# Patient Record
Sex: Male | Born: 1975 | Race: White | Hispanic: No | Marital: Married | Smoking: Never smoker
Health system: Southern US, Community
[De-identification: ages and names within clinical notes are randomized; demographics above are authoritative.]

## PROBLEM LIST (undated history)

## (undated) HISTORY — PX: FINGER SURGERY: SHX640

---

## 1998-10-01 ENCOUNTER — Encounter: Admission: RE | Admit: 1998-10-01 | Discharge: 1998-10-01 | Payer: Self-pay | Admitting: *Deleted

## 1998-10-13 ENCOUNTER — Emergency Department (HOSPITAL_COMMUNITY): Admission: EM | Admit: 1998-10-13 | Discharge: 1998-10-13 | Payer: Self-pay | Admitting: Emergency Medicine

## 2015-07-01 ENCOUNTER — Ambulatory Visit (INDEPENDENT_AMBULATORY_CARE_PROVIDER_SITE_OTHER): Payer: Worker's Compensation | Admitting: Emergency Medicine

## 2015-07-01 VITALS — BP 110/86 | HR 81 | Temp 98.3°F | Resp 16 | Ht 75.0 in | Wt 262.4 lb

## 2015-07-01 DIAGNOSIS — S61412A Laceration without foreign body of left hand, initial encounter: Secondary | ICD-10-CM | POA: Diagnosis not present

## 2015-07-01 DIAGNOSIS — S66922A Laceration of unspecified muscle, fascia and tendon at wrist and hand level, left hand, initial encounter: Secondary | ICD-10-CM

## 2015-07-01 DIAGNOSIS — S6710XA Crushing injury of unspecified finger(s), initial encounter: Secondary | ICD-10-CM

## 2015-07-01 DIAGNOSIS — S61219A Laceration without foreign body of unspecified finger without damage to nail, initial encounter: Secondary | ICD-10-CM

## 2015-07-01 MED ORDER — SULFAMETHOXAZOLE-TRIMETHOPRIM 800-160 MG PO TABS: 1.0000 | ORAL_TABLET | Freq: Two times a day (BID) | ORAL | Status: DC

## 2015-07-01 MED ORDER — HYDROCODONE-ACETAMINOPHEN 5-325 MG PO TABS: 1.0000 | ORAL_TABLET | Freq: Four times a day (QID) | ORAL | Status: DC | PRN

## 2015-07-01 NOTE — Progress Notes (Signed)
Subjective:  Patient ID: Frederick Butler, male    DOB: 1976/04/01  Age: 39 y.o. MRN: [id number]  CC: split left pointer finger recheck   HPI Frederick Butler presents  for follow-up with an injury to his left index finger. He was injured on July 2 and seen in the emergency room or his wound was dressed and sutured he now has come in with marked swelling of the  PIP joint. He he has a wound to his flexor surface as well as the extensor surface. He has marked swelling in his hand. Looks bruised rather than infected.  Still experience moderate pain in his finger.  History Frederick Butler has no past medical history on file.   He has no past surgical history on file.    Review of Systems   Review of systems was unremarkable. Noncontributory  Objective:  BP 110/86 mmHg  Pulse 81  Temp(Src) 98.3 F (36.8 C) (Oral)  Resp 16  Ht  (1.905 m)  Wt 262 lb 6.4 oz (119.024 kg)  BMI 32.80 kg/m2  SpO2 98%  Physical Exam  Constitutional: He is oriented to person, place, and time. He appears well-developed and well-nourished. No distress.  HENT:  Head: Normocephalic and atraumatic.  Right Ear: External ear normal.  Left Ear: External ear normal.  Nose: Nose normal.  Eyes: Conjunctivae and EOM are normal. Pupils are equal, round, and reactive to light. No scleral icterus.  Neck: Normal range of motion. Neck supple. No tracheal deviation present.  Cardiovascular: Normal rate, regular rhythm and normal heart sounds.   Pulmonary/Chest: Effort normal. No respiratory distress. He has no wheezes. He has no rales.  Abdominal: He exhibits no mass. There is no tenderness. There is no rebound and no guarding.  Musculoskeletal: He exhibits no edema.  Lymphadenopathy:    He has no cervical adenopathy.  Neurological: He is alert and oriented to person, place, and time. Coordination normal.  Skin: Skin is warm and dry. Laceration noted. No rash noted.  Psychiatric: He has a normal mood and  affect. His behavior is normal.      Assessment & Plan:   Frederick Butler was seen today for split left pointer finger recheck.  Diagnoses and all orders for this visit:  Laceration of finger of left hand with tendon involvement, initial encounter  Crushed finger, initial encounter  Other orders -     sulfamethoxazole-trimethoprim (BACTRIM DS,SEPTRA DS) 800-160 MG per tablet; Take 1 tablet by mouth 2 (two) times daily. -     HYDROcodone-acetaminophen (NORCO/VICODIN) 5-325 MG per tablet; Take 1-2 tablets by mouth every 6 (six) hours as needed for moderate pain.   I am having Mr. Mangione start on sulfamethoxazole-trimethoprim. I am also having him maintain his cephALEXin and HYDROcodone-acetaminophen.  Meds ordered this encounter  Medications  . DISCONTD: HYDROcodone-acetaminophen (NORCO/VICODIN) 5-325 MG per tablet    Sig: Take 1-2 tablets by mouth every 6 (six) hours as needed for moderate pain.  . cephALEXin (KEFLEX) 500 MG capsule    Sig: Take 500 mg by mouth every 8 (eight) hours.  Marland Kitchen sulfamethoxazole-trimethoprim (BACTRIM DS,SEPTRA DS) 800-160 MG per tablet    Sig: Take 1 tablet by mouth 2 (two) times daily.    Dispense:  20 tablet    Refill:  0  . HYDROcodone-acetaminophen (NORCO/VICODIN) 5-325 MG per tablet    Sig: Take 1-2 tablets by mouth every 6 (six) hours as needed for moderate pain.    Dispense:  30 tablet    Refill:  0   He'll follow-up with the orthopedic surgeon that he has an appointment scheduled with. If he is not able to see him in a week we'll see him back for follow-up. Sutures were removed and a radial gutter splint was applied.  propriate red flag conditions were discussed with the patient as well as actions that should be taken.  Patient expressed his understanding.  Follow-up: Return in about 1 week (around 07/08/2015).  Carmelina DaneAnderson, Josepha Barbier S, MD

## 2015-07-01 NOTE — Patient Instructions (Signed)
Crush Injury, Fingers or Toes °A crush injury to the fingers or toes means the tissues have been damaged by being squeezed (compressed). There will be bleeding into the tissues and swelling. Often, blood will collect under the skin. When this happens, the skin on the finger often dies and may slough off (shed) 1 week to 10 days later. Usually, new skin is growing underneath. If the injury has been too severe and the tissue does not survive, the damaged tissue may begin to turn black over several days.  °Wounds which occur because of the crushing may be stitched (sutured) shut. However, crush injuries are more likely to become infected than other injuries. These wounds may not be closed as tightly as other types of cuts to prevent infection. Nails involved are often lost. These usually grow back over several weeks.  °DIAGNOSIS °X-rays may be taken to see if there is any injury to the bones. °TREATMENT °Broken bones (fractures) may be treated with splinting, depending on the fracture. Often, no treatment is required for fractures of the last bone in the fingers or toes. °HOME CARE INSTRUCTIONS  °· The crushed part should be raised (elevated) above the heart or center of the chest as much as possible for the first several days or as directed. This helps with pain and lessens swelling. Less swelling increases the chances that the crushed part will survive. °· Put ice on the injured area. °¨ Put ice in a plastic bag. °¨ Place a towel between your skin and the bag. °¨ Leave the ice on for 15-20 minutes, 03-04 times a day for the first 2 days. °· Only take over-the-counter or prescription medicines for pain, discomfort, or fever as directed by your caregiver. °· Use your injured part only as directed. °· Change your bandages (dressings) as directed. °· Keep all follow-up appointments as directed by your caregiver. Not keeping your appointment could result in a chronic or permanent injury, pain, and disability. If there is  any problem keeping the appointment, you must call to reschedule. °SEEK IMMEDIATE MEDICAL CARE IF:  °· There is redness, swelling, or increasing pain in the wound area. °· Pus is coming from the wound. °· You have a fever. °· You notice a bad smell coming from the wound or dressing. °· The edges of the wound do not stay together after the sutures have been removed. °· You are unable to move the injured finger or toe. °MAKE SURE YOU:  °· Understand these instructions. °· Will watch your condition. °· Will get help right away if you are not doing well or get worse. °Document Released: 12/04/2005 Document Revised: 02/26/2012 Document Reviewed: 04/21/2011 °ExitCare® Patient Information ©2015 [employer]. This information is not intended to replace advice given to you by your health care provider. Make sure you discuss any questions you have with your health care provider. ° °

## 2016-09-15 ENCOUNTER — Ambulatory Visit (INDEPENDENT_AMBULATORY_CARE_PROVIDER_SITE_OTHER): Payer: BLUE CROSS/BLUE SHIELD

## 2016-09-15 ENCOUNTER — Ambulatory Visit (INDEPENDENT_AMBULATORY_CARE_PROVIDER_SITE_OTHER): Payer: BLUE CROSS/BLUE SHIELD | Admitting: Physician Assistant

## 2016-09-15 VITALS — BP 126/80 | HR 82 | Temp 98.7°F | Resp 17 | Ht 75.0 in | Wt 285.8 lb

## 2016-09-15 DIAGNOSIS — M25561 Pain in right knee: Secondary | ICD-10-CM

## 2016-09-15 DIAGNOSIS — R5383 Other fatigue: Secondary | ICD-10-CM

## 2016-09-15 LAB — D-DIMER, QUANTITATIVE (NOT AT ARMC): D-Dimer, Quant: 0.35 mcg/mL FEU (ref <0.50)

## 2016-09-15 MED ORDER — MELOXICAM 15 MG PO TABS: 15.0000 mg | ORAL_TABLET | Freq: Every day | ORAL | 0 refills | Status: DC

## 2016-09-15 NOTE — Progress Notes (Signed)
[facility], [place] [address], [place] [address] [phone]  Date:  09/15/2016   Name:  Frederick Butler   DOB:  December 06, 1976   MRN:  [id number]  PCP:  No primary care provider on file.    History of Present Illness:  Frederick Butler is a 40 y.o. male patient who presents to [facility] for knee pain. 2 months, no trauma to the knee.  Right knee pain was stiff and swollen.  Last week, his knee pain extends to his calf.  The back of the knee is painful.  He does a lot of stooping and bending and can not bear entire weight on his right knee.   If he takes ibuprofen/acetaminophen 1000/600mg  every day 6-7 days per week. He will have sharp pain that may jolt him but no instability.  No numbness or tingling down the leg.     There are no active problems to display for this patient.   History reviewed. No pertinent past medical history.  Past Surgical History:  Procedure Laterality Date  . FINGER SURGERY      Social History  Substance Use Topics  . Smoking status: Never Smoker  . Smokeless tobacco: Never Used  . Alcohol use No    Family History  Problem Relation Age of Onset  . Hypertension Father     No Known Allergies  Medication list has been reviewed and updated.  No current outpatient prescriptions on file prior to visit.   No current facility-administered medications on file prior to visit.     ROS ROS otherwise unremarkable unless listed above.   Physical Examination: BP 126/80 (BP Location: Right Arm, Patient Position: Sitting, Cuff Size: Large)   Pulse 82   Temp 98.7 F (37.1 C) (Oral)   Resp 17   Ht 6\' 3"  (1.905 m)   Wt 285 lb 12.8 oz (129.6 kg)   SpO2 96%   BMI 35.72 kg/m  Ideal Body Weight: Weight in (lb) to have BMI = 25: 199.6  Physical Exam  Constitutional: He is oriented to person, place, and time. He appears well-developed and well-nourished. No distress.  HENT:  Head: Normocephalic and atraumatic.  Eyes: Conjunctivae  and EOM are normal. Pupils are equal, round, and reactive to light.  Cardiovascular: Normal rate.   Pulmonary/Chest: Effort normal. No respiratory distress.  Musculoskeletal:       Right knee: He exhibits normal range of motion, no swelling, no effusion, no ecchymosis, no erythema, no LCL laxity, normal patellar mobility, no bony tenderness, normal meniscus and no MCL laxity. No tenderness found. No medial joint line, no lateral joint line, no MCL, no LCL and no patellar tendon tenderness noted.  Neurological: He is alert and oriented to person, place, and time.  Skin: Skin is warm and dry. He is not diaphoretic.  Psychiatric: He has a normal mood and affect. His behavior is normal.    Dg Knee Complete 4 Views Right  Result Date: 09/15/2016 CLINICAL DATA:  Right knee pain 2 months with swelling. Pain more in the back of knee EXAM: RIGHT KNEE - COMPLETE 4+ VIEW COMPARISON:  None. FINDINGS: No fracture or dislocation. Small joint effusion. No evidence of arthropathy or other focal bone abnormality. Soft tissues are unremarkable. IMPRESSION: No acute osseous injury of the right knee. Electronically Signed   By: Elige Ko   On: 09/15/2016 12:53    Assessment and Plan: Frederick Butler is a 40 y.o. male who is here today  for cc of right knee pain. Likely inflammation secondary to overuse.  Given mobic and discussed precautions.   Given stretches and ice three times per day for 15 minutes.  Right knee pain - Plan: DG Knee Complete 4 Views Right, D-dimer, quantitative (not at [facility]), meloxicam (MOBIC) 15 MG tablet  Other fatigue - Plan: D-dimer, quantitative (not at [facility])  Trena PlattStephanie English, PA-C [facility] 9/30/201710:56 PM I personally performed the services described in this documentation, which was scribed in my presence. The recorded information has been reviewed and is accurate.

## 2016-09-15 NOTE — Patient Instructions (Addendum)
Please ice the knee three times per day for 15 minutes.  You can use the meloxicam as prescribed. Do not use this with naproxen or ibuprofen.  You can use tylenol.  Please await contact for the lab results.    Knee Pain Knee pain is a common problem. It can have many causes. The pain often goes away by following your doctor's home care instructions. Treatment for ongoing pain will depend on the cause of your pain. If your knee pain continues, more tests may be needed to diagnose your condition. Tests may include X-rays or other imaging studies of your knee. HOME CARE  Take medicines only as told by your doctor.  Rest your knee and keep it raised (elevated) while you are resting.  Do not do things that cause pain or make your pain worse.  Avoid activities where both feet leave the ground at the same time, such as running, jumping rope, or doing jumping jacks.  Apply ice to the knee area:  Put ice in a plastic bag.  Place a towel between your skin and the bag.  Leave the ice on for 20 minutes, 2-3 times a day.  Ask your doctor if you should wear an elastic knee support.  Sleep with a pillow under your knee.  Lose weight if you are overweight. Being overweight can make your knee hurt more.  Do not use any tobacco products, including cigarettes, chewing tobacco, or electronic cigarettes. If you need help quitting, ask your doctor. Smoking may slow the healing of any bone and joint problems that you may have. GET HELP IF:  Your knee pain does not stop, it changes, or it gets worse.  You have a fever along with knee pain.  Your knee gives out or locks up.  Your knee becomes more swollen. GET HELP RIGHT AWAY IF:   Your knee feels hot to the touch.  You have chest pain or trouble breathing.   This information is not intended to replace advice given to you by your health care provider. Make sure you discuss any questions you have with your health care provider.   Document  Released: 03/02/2009 Document Revised: 12/25/2014 Document Reviewed: 02/04/2014 Elsevier Interactive Patient Education 2016 [employer]     IF you received an x-ray today, you will receive an invoice from [facility]. Please contact [facility] at [phone] with questions or concerns regarding your invoice.   IF you received labwork today, you will receive an invoice from [facility]. Please contact [facility] at [phone] with questions or concerns regarding your invoice.   Our billing staff will not be able to assist you with questions regarding bills from these companies.  You will be contacted with the lab results as soon as they are available. The fastest way to get your results is to activate your My Chart account. Instructions are located on the last page of this paperwork. If you have not heard from us regarding the results in 2 weeks, please contact this office.

## 2016-10-10 ENCOUNTER — Ambulatory Visit (INDEPENDENT_AMBULATORY_CARE_PROVIDER_SITE_OTHER): Payer: BLUE CROSS/BLUE SHIELD

## 2016-10-10 ENCOUNTER — Ambulatory Visit (INDEPENDENT_AMBULATORY_CARE_PROVIDER_SITE_OTHER): Payer: BLUE CROSS/BLUE SHIELD | Admitting: Family Medicine

## 2016-10-10 VITALS — BP 140/98 | HR 79 | Temp 97.8°F | Resp 17 | Ht 75.0 in | Wt 288.0 lb

## 2016-10-10 DIAGNOSIS — K5900 Constipation, unspecified: Secondary | ICD-10-CM | POA: Diagnosis not present

## 2016-10-10 DIAGNOSIS — M545 Low back pain, unspecified: Secondary | ICD-10-CM

## 2016-10-10 DIAGNOSIS — R109 Unspecified abdominal pain: Secondary | ICD-10-CM | POA: Diagnosis not present

## 2016-10-10 DIAGNOSIS — R10A1 Flank pain, right side: Secondary | ICD-10-CM | POA: Insufficient documentation

## 2016-10-10 LAB — POC MICROSCOPIC URINALYSIS (UMFC): Mucus: ABSENT

## 2016-10-10 LAB — POCT CBC
Granulocyte percent: 72.3 %G (ref 37–80)
HCT, POC: 44.1 % (ref 43.5–53.7)
Hemoglobin: 15.4 g/dL (ref 14.1–18.1)
LYMPH, POC: 1.3 (ref 0.6–3.4)
MCH: 30.6 pg (ref 27–31.2)
MCHC: 34.9 g/dL (ref 31.8–35.4)
MCV: 87.7 fL (ref 80–97)
MID (CBC): 0.3 (ref 0–0.9)
MPV: 7.8 fL (ref 0–99.8)
PLATELET COUNT, POC: 281 10*3/uL (ref 142–424)
POC Granulocyte: 5.3 (ref 2–6.9)
POC LYMPH %: 22.4 % (ref 10–50)
POC MID %: 5.3 %M (ref 0–12)
RBC: 5.04 M/uL (ref 4.69–6.13)
RDW, POC: 12.9 %
WBC: 6 10*3/uL (ref 4.6–10.2)

## 2016-10-10 LAB — POCT URINALYSIS DIP (MANUAL ENTRY)
Bilirubin, UA: NEGATIVE
Blood, UA: NEGATIVE
Glucose, UA: NEGATIVE
Ketones, POC UA: NEGATIVE
Leukocytes, UA: NEGATIVE
NITRITE UA: NEGATIVE
PH UA: 7.5
Protein Ur, POC: NEGATIVE
SPEC GRAV UA: 1.015
UROBILINOGEN UA: 0.2

## 2016-10-10 LAB — BASIC METABOLIC PANEL
BUN: 7 mg/dL (ref 7–25)
CHLORIDE: 101 mmol/L (ref 98–110)
CO2: 28 mmol/L (ref 20–31)
Calcium: 9.8 mg/dL (ref 8.6–10.3)
Creat: 0.85 mg/dL (ref 0.60–1.35)
Glucose, Bld: 99 mg/dL (ref 65–99)
POTASSIUM: 4.2 mmol/L (ref 3.5–5.3)
Sodium: 139 mmol/L (ref 135–146)

## 2016-10-10 MED ORDER — CYCLOBENZAPRINE HCL 10 MG PO TABS: 10.0000 mg | ORAL_TABLET | Freq: Three times a day (TID) | ORAL | 0 refills | Status: DC | PRN

## 2016-10-10 MED ORDER — SENNOSIDES-DOCUSATE SODIUM 8.6-50 MG PO TABS: 2.0000 | ORAL_TABLET | Freq: Two times a day (BID) | ORAL | 0 refills | Status: DC

## 2016-10-10 MED ORDER — HYDROCODONE-ACETAMINOPHEN 5-325 MG PO TABS: 1.0000 | ORAL_TABLET | Freq: Four times a day (QID) | ORAL | 0 refills | Status: DC | PRN

## 2016-10-10 MED ORDER — KETOROLAC TROMETHAMINE 60 MG/2ML IM SOLN
60.0000 mg | Freq: Once | INTRAMUSCULAR | Status: AC
  Administered 2016-10-10: 60 mg via INTRAMUSCULAR

## 2016-10-10 NOTE — Progress Notes (Signed)
   Subjective:    Patient ID: Frederick HoarChristopher E Butler, male    DOB: September 01, 1976, 40 y.o.   MRN: [id number]  HPI  Patient presents with R flank pain.   R flank pain Began about a month ago, but has worsened in the past two weeks. Became much worse yesterday and this morning, so he decided to present to office today. Has been taking meloxicam only with no improvement in symptoms. Has also started drinking more water, as well as cranberry and grapefruit juice, but this does not seem to have made a difference. Reports the pain is located on the R in his lower back radiating to his R flank. Denies hematuria, dysuria, increased urinary frequency. Endorses some nausea but says this is not new. Denies vomiting. Describes the pain as dull and aching, with intermittent sharp pain, especially with twisting movements. Had difficulty sleeping last night due to pain when laying on the R side and when movement. Reports that his father has had multiple kidney stones. Patient has no personal history of kidney stones.   Review of Systems See HPI.     Objective:   Physical Exam  Constitutional: He is oriented to person, place, and time. He appears well-developed and well-nourished.  Uncomfortable-appearing male  HENT:  Head: Normocephalic and atraumatic.  Pulmonary/Chest: Effort normal. No respiratory distress.  Musculoskeletal:  +CVA tenderness on R. No TTP of back or flank.   Neurological: He is alert and oriented to person, place, and time.  Psychiatric: He has a normal mood and affect. His behavior is normal.      Assessment & Plan:  Right flank pain Concern for kidney stone, given duration of symptoms, location of pain, and colicky nature of pain. Less likely UTI, as no dysuria, hematuria, or increased frequency, however UA obtained today.  - KUB in office today - CBC, BMP - Will manage expectantly for the next 3 days with Flomax and urine straining, as well as PRN pain meds - Toradol shot in office  today  - If no improvement with Flomax after 3 days, will obtain CT and reassess  Tarri AbernethyAbigail J Lancaster, MD, MPH

## 2016-10-10 NOTE — Assessment & Plan Note (Addendum)
Concern for kidney stone, given duration of symptoms, location of pain, and colicky nature of pain. Less likely UTI, as no dysuria, hematuria, or increased frequency, however UA obtained today.  - KUB in office today - CBC, BMP - Will manage expectantly for the next 3 days with Flomax and urine straining, as well as PRN pain meds - Toradol shot in office today  - If no improvement with Flomax after 3 days, will obtain CT and reassess

## 2016-10-10 NOTE — Progress Notes (Signed)
Chief Complaint  Patient presents with  . Back Pain    Onset 4-5 months, yesterday much worse and moved around to rt side    This chart was scribed for Norberto Sorenson MD, by Veverly Fells, at [facility]  This patient was seen in room 8 and the patient's care was started at 1:06 PM.   Subjective:  HPI Comments: Frederick Butler is a 40 y.o. male who [facility] the [facility] complaining of right sided dull/aching flank pain (intermittently sharp) onset 1 month ago which started worsening yesterday.  Patient denies any radiating pain but does feel a pulling sensation in his back when he tries to lift his leg. He also had a "reddish tint" (this morning) to his stool and has been somewhat constipated. He denies any diarrhea.   He has been taking meloxicam but denies any relief.  He denies any urgency, strain, dysuria or hematuria.  Patient has no history of kidney stones but his father has a  history of frequent kidney stones.  Patient used to lift weights often in his 20's and took vitamin supplements.  He also had a period where he was drinking sodas frequently.   - Additional information on Dr. Vira Blanco note.   Was on hydrocodone #30 per month from Dr. Orlan Leavens for 1 year.  Last script was in June.   .No past medical history on file.  Current Outpatient Prescriptions on File Prior to Visit  Medication Sig Dispense Refill  . meloxicam (MOBIC) 15 MG tablet Take 1 tablet (15 mg total) by mouth daily. 30 tablet 0  . ibuprofen (ADVIL,MOTRIN) 200 MG tablet Take 200 mg by mouth every 6 (six) hours as needed. TAKE 1,000MG  DAILY     No current facility-administered medications on file prior to visit.    Denies rashes, diarrhea, nausea/fever, dysuria, hematuria.   Objective:  Back: Main pain over L5- S1, radiating bilaterally but worse on right with some paraspinal spasms.  2+ patellar reflexes.     Vitals:   10/10/16 1204  BP: (!) 140/98   Pulse: 79  Resp: 17  Temp: 97.8 F (36.6 C)  TempSrc: Oral  SpO2: 98%  Weight: 288 lb (130.6 kg)  Height: 6\' 3"  (1.905 m)    No Known Allergies  Results for orders placed or performed in visit on 10/10/16  POCT urinalysis dipstick  Result Value Ref Range   Color, UA yellow yellow   Clarity, UA clear clear   Glucose, UA negative negative   Bilirubin, UA negative negative   Ketones, POC UA negative negative   Spec Grav, UA 1.015    Blood, UA negative negative   pH, UA 7.5    Protein Ur, POC negative negative   Urobilinogen, UA 0.2    Nitrite, UA Negative Negative   Leukocytes, UA Negative Negative  POCT Microscopic Urinalysis ([facility])  Result Value Ref Range   WBC,UR,HPF,POC None None WBC/hpf   RBC,UR,HPF,POC None None RBC/hpf   Bacteria None None, Too numerous to count   Mucus Absent Absent   Epithelial Cells, UR Per Microscopy None None, Too numerous to count cells/hpf  POCT CBC  Result Value Ref Range   WBC 6.0 4.6 - 10.2 K/uL   Lymph, poc 1.3 0.6 - 3.4   POC LYMPH PERCENT 22.4 10 - 50 %L   MID (cbc) 0.3 0 - 0.9   POC MID % 5.3 0 - 12 %M   POC Granulocyte 5.3 2 -  6.9   Granulocyte percent 72.3 37 - 80 %G   RBC 5.04 4.69 - 6.13 M/uL   Hemoglobin 15.4 14.1 - 18.1 g/dL   HCT, POC 16.144.1 09.643.5 - 53.7 %   MCV 87.7 80 - 97 fL   MCH, POC 30.6 27 - 31.2 pg   MCHC 34.9 31.8 - 35.4 g/dL   RDW, POC 04.512.9 %   Platelet Count, POC 281 142 - 424 K/uL   MPV 7.8 0 - 99.8 fL   Dg Abd 1 View  Result Date: 10/10/2016 CLINICAL DATA:  Concern for right kidney stone.  Right flank pain. EXAM: ABDOMEN - 1 VIEW COMPARISON:  None. FINDINGS: Moderate ascending colonic stool. No small bowel distension. Although colonic gas and stool project over the kidneys, especially on the right, no calcific densities are identified. No calcifications over the expected course of the ureters or urinary bladder. IMPRESSION: No plain film evidence of renal calculi. Renal shadows partially obscured by overlying  colonic gas and stool. Possible constipation. Electronically Signed   By: Jeronimo GreavesKyle  Talbot M.D.   On: 10/10/2016 12:59   Dg Knee Complete 4 Views Right  Result Date: 09/15/2016 CLINICAL DATA:  Right knee pain 2 months with swelling. Pain more in the back of knee EXAM: RIGHT KNEE - COMPLETE 4+ VIEW COMPARISON:  None. FINDINGS: No fracture or dislocation. Small joint effusion. No evidence of arthropathy or other focal bone abnormality. Soft tissues are unremarkable. IMPRESSION: No acute osseous injury of the right knee. Electronically Signed   By: Elige KoHetal  Patel   On: 09/15/2016 12:53     Assessment and Plan:   1. Constipation, unspecified constipation type   2. Right flank pain   3. Acute right-sided low back pain without sciatica    Initial suspicion was that pt was having Rt nephrolithiasis due to acute on chronic flank pain and poss hematuria this am along with recurrent stones in his father. However, ua and micro are adequately concentrated without any rbcs and KUB shows only moderate constipation. 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 Start cleanout with sennakot S and cyclobenzaprine followed by top heat and stretching qhs.  OK to cont meloxicam.  If sxs not resolved within 1 mo, RTC for lumbar xray.  If sxs worsen in the interim, RTC to recheck urine and may need abd/pelvic CT.  Orders Placed This Encounter  Procedures  . DG Abd 1 View    Standing Status:   Future    Number of Occurrences:   1    Standing Expiration Date:   10/10/2017    Order Specific Question:   Reason for Exam (SYMPTOM  OR DIAGNOSIS REQUIRED)    Answer:   concern for right kidney stone    Order Specific Question:   Preferred imaging location?    Answer:   External  . Basic metabolic panel    Order Specific Question:   Has the patient fasted?    Answer:   No  . POCT urinalysis dipstick  . POCT Microscopic Urinalysis ([facility])  . POCT CBC    Meds ordered this encounter   Medications  . ketorolac (TORADOL) injection 60 mg  . cyclobenzaprine (FLEXERIL) 10 MG tablet    Sig: Take 1 tablet (10 mg total) by mouth 3 (three) times daily as needed for muscle spasms.    Dispense:  30 tablet    Refill:  0  . senna-docusate (SENOKOT-S) 8.6-50 MG tablet    Sig:  Take 2 tablets by mouth 2 (two) times daily. X 2d, then 1 tab bid x 2d, then 1 tab po qhs x 2d, stop    Dispense:  60 tablet    Refill:  0  . HYDROcodone-acetaminophen (NORCO/VICODIN) 5-325 MG tablet    Sig: Take 1 tablet by mouth every 6 (six) hours as needed for moderate pain.    Dispense:  15 tablet    Refill:  0   Today I have utilized the [place] Controlled Substance Registry's online query to confirm patient's narcotic pain medication history. My review reveals that pt has been on hydrocodone 5mg  #30/mo from Dr. Orlan Leavens for >1 yr but stopped 3 mos prior.  Over 40 min spent in face-to-face evaluation of and consultation with patient and coordination of care.  Over 50% of this time was spent counseling this patient.  I personally performed the services described in this documentation, which was scribed in my presence. The recorded information has been reviewed and considered, and addended by me as needed.   Norberto Sorenson, M.D.  [facility] [address] [place], [place] [address] [phone] phone [phone] fax  10/10/16 11:52 PM

## 2016-10-10 NOTE — Patient Instructions (Addendum)
It was nice meeting you today Mr. Frederick Butler!  If you have any questions or concerns, please feel free to call the office.   Be well,  Dr. Alice Reichert treat the constipation.  Start the muscle relaxant every night. Strain your urine. Heat to the low back.  Ok to use the meloxicam daily and safe the hydrocodone for break through pain.  If the pain continues or worsens, come back so we can decide if you need a Ct scan to evaluate the kidneys and for kidney stones or if you need an xray of your low back with further treatment for chronic low back strain.  IF you received an x-ray today, you will receive an invoice from [facility]. Please contact [facility] at [phone] with questions or concerns regarding your invoice.   IF you received labwork today, you will receive an invoice from [employer]. Please contact [facility] at [phone] with questions or concerns regarding your invoice.   Our billing staff will not be able to assist you with questions regarding bills from these companies.  You will be contacted with the lab results as soon as they are available. The fastest way to get your results is to activate your My Chart account. Instructions are located on the last page of this paperwork. If you have not heard from [place] regarding the results in 2 weeks, please contact this office.     Lumbosacral Strain Lumbosacral strain is a strain of any of the parts that make up your lumbosacral vertebrae. Your lumbosacral vertebrae are the bones that make up the lower third of your backbone. Your lumbosacral vertebrae are held together by muscles and tough, fibrous tissue (ligaments).  CAUSES  A sudden blow to your back can cause lumbosacral strain. Also, anything that causes an excessive stretch of the muscles in the low back can cause this strain. This is typically seen when people exert themselves strenuously, fall, lift heavy objects, bend, or crouch  repeatedly. RISK FACTORS  Physically demanding work.  Participation in pushing or pulling sports or sports that require a sudden twist of the back (tennis, golf, baseball).  Weight lifting.  Excessive lower back curvature.  Forward-tilted pelvis.  Weak back or abdominal muscles or both.  Tight hamstrings. SIGNS AND SYMPTOMS  Lumbosacral strain may cause pain in the area of your injury or pain that moves (radiates) down your leg.  DIAGNOSIS Your health care provider can often diagnose lumbosacral strain through a physical exam. In some cases, you may need tests such as X-ray exams.  TREATMENT  Treatment for your lower back injury depends on many factors that your clinician will have to evaluate. However, most treatment will include the use of anti-inflammatory medicines. HOME CARE INSTRUCTIONS   Avoid hard physical activities (tennis, racquetball, waterskiing) if you are not in proper physical condition for it. This may aggravate or create problems.  If you have a back problem, avoid sports requiring sudden body movements. Swimming and walking are generally safer activities.  Maintain good posture.  Maintain a healthy weight.  For acute conditions, you may put ice on the injured area.  Put ice in a plastic bag.  Place a towel between your skin and the bag.  Leave the ice on for 20 minutes, 2-3 times a day.  When the low back starts healing, stretching and strengthening exercises may be recommended. SEEK MEDICAL CARE IF:  Your back pain is getting worse.  You experience severe back pain not relieved with  medicines. SEEK IMMEDIATE MEDICAL CARE IF:   You have numbness, tingling, weakness, or problems with the use of your arms or legs.  There is a change in bowel or bladder control.  You have increasing pain in any area of the body, including your belly (abdomen).  You notice shortness of breath, dizziness, or feel faint.  You feel sick to your stomach (nauseous),  are throwing up (vomiting), or become sweaty.  You notice discoloration of your toes or legs, or your feet get very cold. MAKE SURE YOU:   Understand these instructions.  Will watch your condition.  Will get help right away if you are not doing well or get worse.   This information is not intended to replace advice given to you by your health care provider. Make sure you discuss any questions you have with your health care provider.   Document Released: 09/13/2005 Document Revised: 12/25/2014 Document Reviewed: 07/23/2013 Elsevier Interactive Patient Education 2016 [employer] Inc[employer]  Back Pain, Adult Back pain is very common in adults.The cause of back pain is rarely dangerous and the pain often gets better over time.The cause of your back pain may not be known. Some common causes of back pain include:  Strain of the muscles or ligaments supporting the spine.  Wear and tear (degeneration) of the spinal disks.  Arthritis.  Direct injury to the back. For many people, back pain may return. Since back pain is rarely dangerous, most people can learn to manage this condition on their own. HOME CARE INSTRUCTIONS Watch your back pain for any changes. The following actions may help to lessen any discomfort you are feeling:  Remain active. It is stressful on your back to sit or stand in one place for long periods of time. Do not sit, drive, or stand in one place for more than 30 minutes at a time. Take short walks on even surfaces as soon as you are able.Try to increase the length of time you walk each day.  Exercise regularly as directed by your health care provider. Exercise helps your back heal faster. It also helps avoid future injury by keeping your muscles strong and flexible.  Do not stay in bed.Resting more than 1-2 days can delay your recovery.  Pay attention to your body when you bend and lift. The most comfortable positions are those that put less stress on your recovering back.  Always use proper lifting techniques, including:  Bending your knees.  Keeping the load close to your body.  Avoiding twisting.  Find a comfortable position to sleep. Use a firm mattress and lie on your side with your knees slightly bent. If you lie on your back, put a pillow under your knees.  Avoid feeling anxious or stressed.Stress increases muscle tension and can worsen back pain.It is important to recognize when you are anxious or stressed and learn ways to manage it, such as with exercise.  Take medicines only as directed by your health care provider. Over-the-counter medicines to reduce pain and inflammation are often the most helpful.Your health care provider may prescribe muscle relaxant drugs.These medicines help dull your pain so you can more quickly return to your normal activities and healthy exercise.  Apply ice to the injured area:  Put ice in a plastic bag.  Place a towel between your skin and the bag.  Leave the ice on for 20 minutes, 2-3 times a day for the first 2-3 days. After that, ice and heat may be alternated to reduce pain and  spasms.  Maintain a healthy weight. Excess weight puts extra stress on your back and makes it difficult to maintain good posture. SEEK MEDICAL CARE IF:  You have pain that is not relieved with rest or medicine.  You have increasing pain going down into the legs or buttocks.  You have pain that does not improve in one week.  You have night pain.  You lose weight.  You have a fever or chills. SEEK IMMEDIATE MEDICAL CARE IF:   You develop new bowel or bladder control problems.  You have unusual weakness or numbness in your arms or legs.  You develop nausea or vomiting.  You develop abdominal pain.  You feel faint.   This information is not intended to replace advice given to you by your health care provider. Make sure you discuss any questions you have with your health care provider.   Document Released: 12/04/2005  Document Revised: 12/25/2014 Document Reviewed: 04/07/2014 Elsevier Interactive Patient Education 2016 [employer]  Constipation, Adult Constipation is when a person has fewer than three bowel movements a week, has difficulty having a bowel movement, or has stools that are dry, hard, or larger than normal. As people grow older, constipation is more common. A low-fiber diet, not taking in enough fluids, and taking certain medicines may make constipation worse.  CAUSES   Certain medicines, such as antidepressants, pain medicine, iron supplements, antacids, and water pills.   Certain diseases, such as diabetes, irritable bowel syndrome (IBS), thyroid disease, or depression.   Not drinking enough water.   Not eating enough fiber-rich foods.   Stress or travel.   Lack of physical activity or exercise.   Ignoring the urge to have a bowel movement.   Using laxatives too much.  SIGNS AND SYMPTOMS   Having fewer than three bowel movements a week.   Straining to have a bowel movement.   Having stools that are hard, dry, or larger than normal.   Feeling full or bloated.   Pain in the lower abdomen.   Not feeling relief after having a bowel movement.  DIAGNOSIS  Your health care provider will take a medical history and perform a physical exam. Further testing may be done for severe constipation. Some tests may include:  A barium enema X-ray to examine your rectum, colon, and, sometimes, your small intestine.   A sigmoidoscopy to examine your lower colon.   A colonoscopy to examine your entire colon. TREATMENT  Treatment will depend on the severity of your constipation and what is causing it. Some dietary treatments include drinking more fluids and eating more fiber-rich foods. Lifestyle treatments may include regular exercise. If these diet and lifestyle recommendations do not help, your health care provider may recommend taking over-the-counter laxative medicines  to help you have bowel movements. Prescription medicines may be prescribed if over-the-counter medicines do not work.  HOME CARE INSTRUCTIONS   Eat foods that have a lot of fiber, such as fruits, vegetables, whole grains, and beans.  Limit foods high in fat and processed sugars, such as french fries, hamburgers, cookies, candies, and soda.   A fiber supplement may be added to your diet if you cannot get enough fiber from foods.   Drink enough fluids to keep your urine clear or pale yellow.   Exercise regularly or as directed by your health care provider.   Go to the restroom when you have the urge to go. Do not hold it.   Only take over-the-counter or prescription medicines as  directed by your health care provider. Do not take other medicines for constipation without talking to your health care provider first.  SEEK IMMEDIATE MEDICAL CARE IF:   You have bright red blood in your stool.   Your constipation lasts for more than 4 days or gets worse.   You have abdominal or rectal pain.   You have thin, pencil-like stools.   You have unexplained weight loss. MAKE SURE YOU:   Understand these instructions.  Will watch your condition.  Will get help right away if you are not doing well or get worse.   This information is not intended to replace advice given to you by your health care provider. Make sure you discuss any questions you have with your health care provider.   Document Released: 09/01/2004 Document Revised: 12/25/2014 Document Reviewed: 09/15/2013 Elsevier Interactive Patient Education [employer]

## 2016-10-18 ENCOUNTER — Encounter: Payer: Self-pay | Admitting: Physician Assistant

## 2016-10-18 ENCOUNTER — Ambulatory Visit (INDEPENDENT_AMBULATORY_CARE_PROVIDER_SITE_OTHER): Payer: BLUE CROSS/BLUE SHIELD

## 2016-10-18 ENCOUNTER — Ambulatory Visit (INDEPENDENT_AMBULATORY_CARE_PROVIDER_SITE_OTHER): Payer: BLUE CROSS/BLUE SHIELD | Admitting: Physician Assistant

## 2016-10-18 VITALS — BP 144/98 | HR 90 | Temp 98.1°F | Resp 17 | Ht 75.0 in | Wt 286.0 lb

## 2016-10-18 DIAGNOSIS — M545 Low back pain: Secondary | ICD-10-CM | POA: Diagnosis not present

## 2016-10-18 MED ORDER — MELOXICAM 15 MG PO TABS: 15.0000 mg | ORAL_TABLET | Freq: Every day | ORAL | 1 refills | Status: DC

## 2016-10-18 MED ORDER — CYCLOBENZAPRINE HCL 5 MG PO TABS: 5.0000 mg | ORAL_TABLET | Freq: Three times a day (TID) | ORAL | 0 refills | Status: DC | PRN

## 2016-10-18 NOTE — Progress Notes (Signed)
MRN: [id number] DOB: 1976-06-29  Subjective:   Frederick Butler is a 40 y.o. male presenting for follow up on lower right side back pain x 2 months. Was initially seen on 10/10/16 for possible kidney stone. Received torodol shot and given prescription for flomax and flexeril. Told to follow up in 3 days if no improvement. States the pain did seem to lighten up after his visit but he is still having issues so he came back. He is taking the flexeril intermittently.  Pt rates the pain as a 8-9/10. Notes that the pain is worsened with stretching, bending, sneezing, and riding in a car. Pain is made better with lying in bed and not moving. Has radiating pain to the anterior thigh. Denies acute injury, hematuria, dysuria, testicular pain, pain at rest,  bladder/bowel incontinence, and saddle anesthesia. Pt used to be extremely active but notes he just walks at work now but does no structured exercise. Of note, pt has not tried meloxicam because he thought it was his kidneys and he knew that this could be irritating to the kidneys.   Frederick Butler has a current medication list which includes the following prescription(s): cyclobenzaprine and ibuprofen. Also has No Known Allergies.  Frederick Butler  has no past medical history on file. Also  has a past surgical history that includes Finger surgery.  Objective:   Vitals: BP (!) 144/98 (BP Location: Right Arm, Patient Position: Sitting, Cuff Size: Large)   Pulse 90   Temp 98.1 F (36.7 C) (Oral)   Resp 17   Ht 6\' 3"  (1.905 m)   Wt 286 lb (129.7 kg)   SpO2 100%   BMI 35.75 kg/m   Physical Exam  Constitutional: He is oriented to person, place, and time. He appears well-developed and well-nourished. He appears distressed (mild).  HENT:  Head: Normocephalic and atraumatic.  Eyes: Conjunctivae are normal.  Neck: Normal range of motion.  Pulmonary/Chest: Effort normal.  Abdominal: There is no tenderness. There is no CVA tenderness.    Musculoskeletal:       Lumbar back: He exhibits decreased range of motion ( pt refuses to bend due to fear of eliciting pain), tenderness ( most notable upon palpation of right sided muscles, pain is reproducbilbe), bony tenderness and spasm (along right side muscles). He exhibits no swelling.  Limited back exam performed due to pt's fear of eliciting the pain with certain movements.   Neurological: He is alert and oriented to person, place, and time.  Reflex Scores:      Patellar reflexes are 2+ on the right side and 2+ on the left side.      Achilles reflexes are 2+ on the right side and 2+ on the left side. Negative SLR  Skin: Skin is warm and dry.  Psychiatric: He has a normal mood and affect.  Vitals reviewed.  No results found for this or any previous visit (from the past 24 hour(s)).  . Dg Lumbar Spine Complete  Result Date: 10/18/2016 CLINICAL DATA:  Low back pain EXAM: LUMBAR SPINE - COMPLETE 4+ VIEW COMPARISON:  10/10/2016 FINDINGS: Five non rib-bearing lumbar type vertebra. Lumbar alignment within normal limits. Vertebral body heights are maintained. Mild degenerative changes of the lower thoracic spine and at L1-L2 and L2-L3. IMPRESSION: Mild degenerative changes.  No acute osseous abnormality. Electronically Signed   By: Jasmine PangKim  Fujinaga M.D.   On: 10/18/2016 19:02    Assessment and Plan :  This case was precepted with Dr. Neva SeatGreene  1. Right  low back pain, unspecified chronicity, with sciatica presence unspecified -Likely musculoskeletal. Pain is reproducible on exam. Will treat with daily NSAIDS and muscle relaxants. Pt also given educational material for back stretches once he can tolerate. Pt informed if no improvement with this treatment in one week, follow up for further evaluation. May consider referral at this time to either PT or ortho.  - DG Lumbar Spine Complete; Future - meloxicam (MOBIC) 15 MG tablet; Take 1 tablet (15 mg total) by mouth daily.  Dispense: 30 tablet;  Refill: 1 - cyclobenzaprine (FLEXERIL) 5 MG tablet; Take 1 tablet (5 mg total) by mouth 3 (three) times daily as needed for muscle spasms.  Dispense: 60 tablet; Refill: 0  Benjiman CoreBrittany Lavoris Sparling, PA-C  [facility] 10/18/2016 7:05 PM

## 2016-10-18 NOTE — Patient Instructions (Addendum)
Follow up in one week if no improvement with treatment. Take meloxicam and flexeril daily for one week.   I recommend resting today. However, tomorrow I would begin walking and moving around as much as tolerated. Begin stretching in a couple of days. The worse thing you can do for low back pain is lie in bed all day or sit down all day. Use medications as needed.   Just to know, flexeril can cause side effects that may impair your thinking or reactions. Be careful if you drive or do anything that requires you to be awake and alert. void drinking alcohol, which can increase some of the side effects of Flexeril.  NSAIDs like meloxicam have common side effects of heartburn, stomach pain, indigestion, and headache. Could lead to renal insufficiency, stroke, or GI bleed if taken excess amounts outside of what is recommended on label long term.    You should avoid heavy lifting or strenuous repetitive activity to prevent recurrence of event. Use heat pad, do not apply directly to skin, use barrier such as towel over the skin. Leave on for 15-20 minutes, 3-4 times a day.  Please perform exercises below. Stretches are to be performed for 2 sets, holding 10-15 seconds each. Recommended to perform this rehab twice daily within pain tolerance for 2 weeks.   FLEXION RANGE OF MOTION AND STRETCHING EXERCISES: STRETCH - Flexion, Single Knee to Chest   Lie on a firm bed or floor with both legs extended in front of you.  Keeping one leg in contact with the floor, bring your opposite knee to your chest. Hold your leg in place by either grabbing behind your thigh or at your knee.  Pull until you feel a gentle stretch in your lower back.   Slowly release your grasp and repeat the exercise with the opposite side.  STRETCH - Flexion, Double Knee to Chest   Lie on a firm bed or floor with both legs extended in front of you.  Keeping one leg in contact with the floor, bring your opposite knee to your  chest.  Tense your stomach muscles to support your back and then lift your other knee to your chest. Hold your legs in place by either grabbing behind your thighs or at your knees.  Pull both knees toward your chest until you feel a gentle stretch in your lower back.   Tense your stomach muscles and slowly return one leg at a time to the floor.  STRETCH - Low Trunk Rotation  Lie on a firm bed or floor. Keeping your legs in front of you, bend your knees so they are both pointed toward the ceiling and your feet are flat on the floor.  Extend your arms out to the side. This will stabilize your upper body by keeping your shoulders in contact with the floor.  Gently and slowly drop both knees together to one side until you feel a gentle stretch in your lower back.   Tense your stomach muscles to support your lower back as you bring your knees back to the starting position. Repeat the exercise to the other side.   EXTENSION RANGE OF MOTION AND FLEXIBILITY EXERCISES: STRETCH - Extension, Prone on Elbows   Lie on your stomach on the floor, a bed will be too soft. Place your palms about shoulder width apart and at the height of your head.  Place your elbows under your shoulders. If this is too painful, stack pillows under your chest.  Allow your  body to relax so that your hips drop lower and make contact more completely with the floor.  Slowly return to lying flat on the floor.  RANGE OF MOTION - Extension, Prone Press Ups  Lie on your stomach on the floor, a bed will be too soft. Place your palms about shoulder width apart and at the height of your head.  Keeping your back as relaxed as possible, slowly straighten your elbows while keeping your hips on the floor. You may adjust the placement of your hands to maximize your comfort. As you gain motion, your hands will come more underneath your shoulders.  Slowly return to lying flat on the floor.  RANGE OF MOTION- Quadruped, Neutral Spine    Assume a hands and knees position on a firm surface. Keep your hands under your shoulders and your knees under your hips. You may place padding under your knees for comfort.  Drop your head and point your tail bone toward the ground below you. This will round out your lower back like an angry cat.    Slowly lift your head and release your tail bone so that your back sags into a large arch, like an old horse.  Repeat this until you feel limber in your lower back.  Now, find your "sweet spot." This will be the most comfortable position somewhere between the two previous positions. This is your neutral spine. Once you have found this position, tense your stomach muscles to support your lower back.  STRENGTHENING EXERCISES - Low Back Strain These exercises may help you when beginning to rehabilitate your injury. These exercises should be done near your "sweet spot." This is the neutral, low-back arch, somewhere between fully rounded and fully arched, that is your least painful position. When performed in this safe range of motion, these exercises can be used for people who have either a flexion or extension based injury. These exercises may resolve your symptoms with or without further involvement from your physician, physical therapist or athletic trainer. While completing these exercises, remember:   Muscles can gain both the endurance and the strength needed for everyday activities through controlled exercises.  Complete these exercises as instructed by your physician, physical therapist or athletic trainer. Increase the resistance and repetitions only as guided.  You may experience muscle soreness or fatigue, but the pain or discomfort you are trying to eliminate should never worsen during these exercises. If this pain does worsen, stop and make certain you are following the directions exactly. If the pain is still present after adjustments, discontinue the exercise until you can discuss the  trouble with your caregiver.  STRENGTHENING - Deep Abdominals, Pelvic Tilt  Lie on a firm bed or floor. Keeping your legs in front of you, bend your knees so they are both pointed toward the ceiling and your feet are flat on the floor.  Tense your lower abdominal muscles to press your lower back into the floor. This motion will rotate your pelvis so that your tail bone is scooping upwards rather than pointing at your feet or into the floor.  STRENGTHENING - Abdominals, Crunches   Lie on a firm bed or floor. Keeping your legs in front of you, bend your knees so they are both pointed toward the ceiling and your feet are flat on the floor. Cross your arms over your chest.  Slightly tip your chin down without bending your neck.  Tense your abdominals and slowly lift your trunk high enough to just clear your  shoulder blades. Lifting higher can put excessive stress on the lower back and does not further strengthen your abdominal muscles.  Control your return to the starting position.  STRENGTHENING - Quadruped, Opposite UE/LE Lift   Assume a hands and knees position on a firm surface. Keep your hands under your shoulders and your knees under your hips. You may place padding under your knees for comfort.  Find your neutral spine and gently tense your abdominal muscles so that you can maintain this position. Your shoulders and hips should form a rectangle that is parallel with the floor and is not twisted.  Keeping your trunk steady, lift your right hand no higher than your shoulder and then your left leg no higher than your hip. Make sure you are not holding your breath.   Continuing to keep your abdominal muscles tense and your back steady, slowly return to your starting position. Repeat with the opposite arm and leg.  STRENGTHENING - Lower Abdominals, Double Knee Lift  Lie on a firm bed or floor. Keeping your legs in front of you, bend your knees so they are both pointed toward the ceiling  and your feet are flat on the floor.  Tense your abdominal muscles to brace your lower back and slowly lift both of your knees until they come over your hips. Be certain not to hold your breath.  POSTURE AND BODY MECHANICS CONSIDERATIONS - Low Back Strain Keeping correct posture when sitting, standing or completing your activities will reduce the stress put on different body tissues, allowing injured tissues a chance to heal and limiting painful experiences. The following are general guidelines for improved posture. Your physician or physical therapist will provide you with any instructions specific to your needs. While reading these guidelines, remember:  The exercises prescribed by your provider will help you have the flexibility and strength to maintain correct postures.  The correct posture provides the best environment for your joints to work. All of your joints have less wear and tear when properly supported by a spine with good posture. This means you will experience a healthier, less painful body.  Correct posture must be practiced with all of your activities, especially prolonged sitting and standing. Correct posture is as important when doing repetitive low-stress activities (typing) as it is when doing a single heavy-load activity (lifting). RESTING POSITIONS Consider which positions are most painful for you when choosing a resting position. If you have pain with flexion-based activities (sitting, bending, stooping, squatting), choose a position that allows you to rest in a less flexed posture. You would want to avoid curling into a fetal position on your side. If your pain worsens with extension-based activities (prolonged standing, working overhead), avoid resting in an extended position such as sleeping on your stomach. Most people will find more comfort when they rest with their spine in a more neutral position, neither too rounded nor too arched. Lying on a non-sagging bed on your side  with a pillow between your knees, or on your back with a pillow under your knees will often provide some relief. Keep in mind, being in any one position for a prolonged period of time, no matter how correct your posture, can still lead to stiffness. PROPER SITTING POSTURE In order to minimize stress and discomfort on your spine, you must sit with correct posture. Sitting with good posture should be effortless for a healthy body. Returning to good posture is a gradual process. Many people can work toward this most comfortably by  using various supports until they have the flexibility and strength to maintain this posture on their own. When sitting with proper posture, your ears will fall over your shoulders and your shoulders will fall over your hips. You should use the back of the chair to support your upper back. Your lower back will be in a neutral position, just slightly arched. You may place a small pillow or folded towel at the base of your lower back for support.  When working at a desk, create an environment that supports good, upright posture. Without extra support, muscles tire, which leads to excessive strain on joints and other tissues. Keep these recommendations in mind: CHAIR:  A chair should be able to slide under your desk when your back makes contact with the back of the chair. This allows you to work closely.  The chair's height should allow your eyes to be level with the upper part of your monitor and your hands to be slightly lower than your elbows. BODY POSITION  Your feet should make contact with the floor. If this is not possible, use a foot rest.  Keep your ears over your shoulders. This will reduce stress on your neck and lower back. INCORRECT SITTING POSTURES  If you are feeling tired and unable to assume a healthy sitting posture, do not slouch or slump. This puts excessive strain on your back tissues, causing more damage and pain. Healthier options include:  Using more  support, like a lumbar pillow.  Switching tasks to something that requires you to be upright or walking.  Talking a brief walk.  Lying down to rest in a neutral-spine position. PROLONGED STANDING WHILE SLIGHTLY LEANING FORWARD  When completing a task that requires you to lean forward while standing in one place for a long time, place either foot up on a stationary 2-4 inch high object to help maintain the best posture. When both feet are on the ground, the lower back tends to lose its slight inward curve. If this curve flattens (or becomes too large), then the back and your other joints will experience too much stress, tire more quickly, and can cause pain. CORRECT STANDING POSTURES Proper standing posture should be assumed with all daily activities, even if they only take a few moments, like when brushing your teeth. As in sitting, your ears should fall over your shoulders and your shoulders should fall over your hips. You should keep a slight tension in your abdominal muscles to brace your spine. Your tailbone should point down to the ground, not behind your body, resulting in an over-extended swayback posture.  INCORRECT STANDING POSTURES  Common incorrect standing postures include a forward head, locked knees and/or an excessive swayback. WALKING Walk with an upright posture. Your ears, shoulders and hips should all line-up. PROLONGED ACTIVITY IN A FLEXED POSITION When completing a task that requires you to bend forward at your waist or lean over a low surface, try to find a way to stabilize 3 out of 4 of your limbs. You can place a hand or elbow on your thigh or rest a knee on the surface you are reaching across. This will provide you more stability so that your muscles do not fatigue as quickly. By keeping your knees relaxed, or slightly bent, you will also reduce stress across your lower back. CORRECT LIFTING TECHNIQUES DO :   Assume a wide stance. This will provide you more stability and  the opportunity to get as close as possible to the object  which you are lifting.  Tense your abdominals to brace your spine. Bend at the knees and hips. Keeping your back locked in a neutral-spine position, lift using your leg muscles. Lift with your legs, keeping your back straight.  Test the weight of unknown objects before attempting to lift them.  Try to keep your elbows locked down at your sides in order get the best strength from your shoulders when carrying an object.  Always ask for help when lifting heavy or awkward objects. INCORRECT LIFTING TECHNIQUES DO NOT:   Lock your knees when lifting, even if it is a small object.  Bend and twist. Pivot at your feet or move your feet when needing to change directions.  Assume that you can safely pick up even a paper clip without proper posture.       IF you received an x-ray today, you will receive an invoice from [facility]. Please contact [facility] at [phone] with questions or concerns regarding your invoice.   IF you received labwork today, you will receive an invoice from [facility]. Please contact [facility] at [phone] with questions or concerns regarding your invoice.   Our billing staff will not be able to assist you with questions regarding bills from these companies.  You will be contacted with the lab results as soon as they are available. The fastest way to get your results is to activate your My Chart account. Instructions are located on the last page of this paperwork. If you have not heard from us regarding the results in 2 weeks, please contact this office.

## 2017-01-05 ENCOUNTER — Ambulatory Visit (INDEPENDENT_AMBULATORY_CARE_PROVIDER_SITE_OTHER): Payer: BLUE CROSS/BLUE SHIELD | Admitting: Physician Assistant

## 2017-01-05 VITALS — BP 130/80 | HR 110 | Temp 98.3°F | Resp 17 | Ht 75.0 in | Wt 286.0 lb

## 2017-01-05 DIAGNOSIS — M5431 Sciatica, right side: Secondary | ICD-10-CM

## 2017-01-05 DIAGNOSIS — M62838 Other muscle spasm: Secondary | ICD-10-CM | POA: Diagnosis not present

## 2017-01-05 MED ORDER — CYCLOBENZAPRINE HCL 10 MG PO TABS: 10.0000 mg | ORAL_TABLET | Freq: Three times a day (TID) | ORAL | 1 refills | Status: AC | PRN

## 2017-01-05 MED ORDER — PREDNISONE 20 MG PO TABS: ORAL_TABLET | ORAL | 0 refills | Status: DC

## 2017-01-05 NOTE — Patient Instructions (Addendum)
Take prednisone as prescribed for inflammation.  Use flexeril for spasms as needed. I have given you a refill for future issues.  You should hear from PT in the next couple of weeks.  -Return to clinic if symptoms worsen, do not improve in 7-10 days, or as needed  Sciatica Introduction Sciatica is pain, numbness, weakness, or tingling along your sciatic nerve. The sciatic nerve starts in the lower back and goes down the back of each leg. Sciatica happens when this nerve is pinched or has pressure put on it. Sciatica usually goes away on its own or with treatment. Sometimes, sciatica may keep coming back (recur). Follow these instructions at home: Medicines  Take over-the-counter and prescription medicines only as told by your doctor.  Do not drive or use heavy machinery while taking prescription pain medicine. Managing pain  If directed, put ice on the affected area.  Put ice in a plastic bag.  Place a towel between your skin and the bag.  Leave the ice on for 20 minutes, 2-3 times a day.  After icing, apply heat to the affected area before you exercise or as often as told by your doctor. Use the heat source that your doctor tells you to use, such as a moist heat pack or a heating pad.  Place a towel between your skin and the heat source.  Leave the heat on for 20-30 minutes.  Remove the heat if your skin turns bright red. This is especially important if you are unable to feel pain, heat, or cold. You may have a greater risk of getting burned. Activity  Return to your normal activities as told by your doctor. Ask your doctor what activities are safe for you.  Avoid activities that make your sciatica worse.  Take short rests during the day. Rest in a lying or standing position. This is usually better than sitting to rest.  When you rest for a long time, do some physical activity or stretching between periods of rest.  Avoid sitting for a long time without moving. Get up and  move around at least one time each hour.  Exercise and stretch regularly, as told by your doctor.  Do not lift anything that is heavier than 10 lb (4.5 kg) while you have symptoms of sciatica.  Avoid lifting heavy things even when you do not have symptoms.  Avoid lifting heavy things over and over.  When you lift objects, always lift in a way that is safe for your body. To do this, you should:  Bend your knees.  Keep the object close to your body.  Avoid twisting. General instructions  Use good posture.  Avoid leaning forward when you are sitting.  Avoid hunching over when you are standing.  Stay at a healthy weight.  Wear comfortable shoes that support your feet. Avoid wearing high heels.  Avoid sleeping on a mattress that is too soft or too hard. You might have less pain if you sleep on a mattress that is firm enough to support your back.  Keep all follow-up visits as told by your doctor. This is important. Contact a doctor if:  You have pain that:  Wakes you up when you are sleeping.  Gets worse when you lie down.  Is worse than the pain you have had in the past.  Lasts longer than 4 weeks.  You lose weight for without trying. Get help right away if:  You cannot control when you pee (urinate) or poop (have a  bowel movement).  You have weakness in any of these areas and it gets worse.  Lower back.  Lower belly (pelvis).  Butt (buttocks).  Legs.  You have redness or swelling of your back.  You have a burning feeling when you pee. This information is not intended to replace advice given to you by your health care provider. Make sure you discuss any questions you have with your health care provider. Document Released: 09/12/2008 Document Revised: 05/11/2016 Document Reviewed: 08/13/2015  2017 Elsevier     Sciatica Rehab Ask your health care provider which exercises are safe for you. Do exercises exactly as told by your health care provider and  adjust them as directed. It is normal to feel mild stretching, pulling, tightness, or discomfort as you do these exercises, but you should stop right away if you feel sudden pain or your pain gets worse.Do not begin these exercises until told by your health care provider. Stretching and range of motion exercises These exercises warm up your muscles and joints and improve the movement and flexibility of your hips and your back. These exercises also help to relieve pain, numbness, and tingling. Exercise A: Sciatic nerve glide 1. Sit in a chair with your head facing down toward your chest. Place your hands behind your back. Let your shoulders slump forward. 2. Slowly straighten one of your knees while you tilt your head back as if you are looking toward the ceiling. Only straighten your leg as far as you can without making your symptoms worse. 3. Hold for __________ seconds. 4. Slowly return to the starting position. 5. Repeat with your other leg. Repeat __________ times. Complete this exercise __________ times a day. Exercise B: Knee to chest with hip adduction and internal rotation 1. Lie on your back on a firm surface with both legs straight. 2. Bend one of your knees and move it up toward your chest until you feel a gentle stretch in your lower back and buttock. Then, move your knee toward the shoulder that is on the opposite side from your leg.  Hold your leg in this position by holding onto the front of your knee. 3. Hold for __________ seconds. 4. Slowly return to the starting position. 5. Repeat with your other leg. Repeat __________ times. Complete this exercise __________ times a day. Exercise C: Prone extension on elbows 1. Lie on your abdomen on a firm surface. A bed may be too soft for this exercise. 2. Prop yourself up on your elbows. 3. Use your arms to help lift your chest up until you feel a gentle stretch in your abdomen and your lower back.  This will place some of your body  weight on your elbows. If this is uncomfortable, try stacking pillows under your chest.  Your hips should stay down, against the surface that you are lying on. Keep your hip and back muscles relaxed. 4. Hold for __________ seconds. 5. Slowly relax your upper body and return to the starting position. Repeat __________ times. Complete this exercise __________ times a day. Strengthening exercises These exercises build strength and endurance in your back. Endurance is the ability to use your muscles for a long time, even after they get tired. Exercise D: Pelvic tilt 1. Lie on your back on a firm surface. Bend your knees and keep your feet flat. 2. Tense your abdominal muscles. Tip your pelvis up toward the ceiling and flatten your lower back into the floor.  To help with this exercise, you may  place a small towel under your lower back and try to push your back into the towel. 3. Hold for __________ seconds. 4. Let your muscles relax completely before you repeat this exercise. Repeat __________ times. Complete this exercise __________ times a day. Exercise E: Alternating arm and leg raises 1. Get on your hands and knees on a firm surface. If you are on a hard floor, you may want to use padding to cushion your knees, such as an exercise mat. 2. Line up your arms and legs. Your hands should be below your shoulders, and your knees should be below your hips. 3. Lift your left leg behind you. At the same time, raise your right arm and straighten it in front of you.  Do not lift your leg higher than your hip.  Do not lift your arm higher than your shoulder.  Keep your abdominal and back muscles tight.  Keep your hips facing the ground.  Do not arch your back.  Keep your balance carefully, and do not hold your breath. 4. Hold for __________ seconds. 5. Slowly return to the starting position and repeat with your right leg and your left arm. Repeat __________ times. Complete this exercise  __________ times a day. Posture and body mechanics   Body mechanics refers to the movements and positions of your body while you do your daily activities. Posture is part of body mechanics. Good posture and healthy body mechanics can help to relieve stress in your body's tissues and joints. Good posture means that your spine is in its natural S-curve position (your spine is neutral), your shoulders are pulled back slightly, and your head is not tipped forward. The following are general guidelines for applying improved posture and body mechanics to your everyday activities. Standing   When standing, keep your spine neutral and your feet about hip-width apart. Keep a slight bend in your knees. Your ears, shoulders, and hips should line up.  When you do a task in which you stand in one place for a long time, place one foot up on a stable object that is 2-4 inches (5-10 cm) high, such as a footstool. This helps keep your spine neutral. Sitting  When sitting, keep your spine neutral and keep your feet flat on the floor. Use a footrest, if necessary, and keep your thighs parallel to the floor. Avoid rounding your shoulders, and avoid tilting your head forward.  When working at a desk or a computer, keep your desk at a height where your hands are slightly lower than your elbows. Slide your chair under your desk so you are close enough to maintain good posture.  When working at a computer, place your monitor at a height where you are looking straight ahead and you do not have to tilt your head forward or downward to look at the screen. Resting   When lying down and resting, avoid positions that are most painful for you.  If you have pain with activities such as sitting, bending, stooping, or squatting (flexion-based activities), lie in a position in which your body does not bend very much. For example, avoid curling up on your side with your arms and knees near your chest (fetal position).  If you  have pain with activities such as standing for a long time or reaching with your arms (extension-based activities), lie with your spine in a neutral position and bend your knees slightly. Try the following positions:  Lying on your side with a pillow between your  knees.  Lying on your back with a pillow under your knees. Lifting   When lifting objects, keep your feet at least shoulder-width apart and tighten your abdominal muscles.  Bend your knees and hips and keep your spine neutral. It is important to lift using the strength of your legs, not your back. Do not lock your knees straight out.  Always ask for help to lift heavy or awkward objects. This information is not intended to replace advice given to you by your health care provider. Make sure you discuss any questions you have with your health care provider. Document Released: 12/04/2005 Document Revised: 08/10/2016 Document Reviewed: 08/20/2015 Elsevier Interactive Patient Education  2017 [employer].  IF you received an x-ray today, you will receive an invoice from [facility]. Please contact [facility] at [phone] with questions or concerns regarding your invoice.   IF you received labwork today, you will receive an invoice from [facility]. Please contact [facility] at [phone] with questions or concerns regarding your invoice.   Our billing staff will not be able to assist you with questions regarding bills from these companies.  You will be contacted with the lab results as soon as they are available. The fastest way to get your results is to activate your My Chart account. Instructions are located on the last page of this paperwork. If you have not heard from [place] regarding the results in 2 weeks, please contact this office.

## 2017-01-05 NOTE — Progress Notes (Signed)
   Frederick HoarChristopher E Hovanec  MRN: [id number] DOB: Dec 22, 1975  Subjective:  Frederick Butler is a 41 y.o. male seen in office today for a chief complaint of worsening back pain x 3 days.  He has done some heavy lifting at work and at home about 4-5 days ago and notes he has some major tightness in the right lower back. Has associated spasm and right sided sciatica with shooting pain and numbness down back of right leg into foot. Denies urinary/bowel incontinence, saddle anesthesia, and hematuria. 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 His last episode of back spasm was 10/18/16. He was treated successfully with meloxicam and flexeril. He had no left over medication so he took ibuprofen 1000mg  and tylenol five hours prior to arrival with no relief.   Review of Systems  Constitutional: Negative for chills, diaphoresis, fatigue and fever.  Gastrointestinal: Negative for abdominal pain, nausea and vomiting.  Genitourinary: Negative for difficulty urinating and dysuria.    Patient Active Problem List   Diagnosis Date Noted  . Right flank pain 10/10/2016    Current Outpatient Prescriptions on File Prior to Visit  Medication Sig Dispense Refill  . ibuprofen (ADVIL,MOTRIN) 200 MG tablet Take 200 mg by mouth every 6 (six) hours as needed. TAKE 1,000MG  DAILY     No current facility-administered medications on file prior to visit.     No Known Allergies   Objective:  BP 130/80 (BP Location: Right Arm, Patient Position: Sitting, Cuff Size: Normal)   Pulse (!) 110   Temp 98.3 F (36.8 C) (Oral)   Resp 17   Ht 6\' 3"  (1.905 m)   Wt 286 lb (129.7 kg)   SpO2 95%   BMI 35.75 kg/m   Physical Exam  Constitutional: He is oriented to person, place, and time.  Pt in pain, he is not putting pressure on right buttocks while sitting in chair, leaning on left side of chair.   HENT:  Head: Normocephalic and atraumatic.  Eyes: Conjunctivae are normal.    Neck: Normal range of motion.  Pulmonary/Chest: Effort normal.  Musculoskeletal:       Lumbar back: He exhibits decreased range of motion, tenderness (along palpation of spasm) and spasm (noted in right sided musculature). He exhibits no bony tenderness.  Neurological: He is alert and oriented to person, place, and time. He has normal sensation, normal strength, normal reflexes and intact cranial nerves. He has an abnormal Straight Leg Raise Test. Gait normal.  Skin: Skin is warm and dry.  Psychiatric: Affect normal.  Vitals reviewed.  Assessment and Plan :  1. Sciatica of right side -Pt declined in office steroid injection. He would like to be treated orally. He was given stretches to perform at home and a referral to PT. Instructed to return to clinic if symptoms worsen, do not improve with treatment, or as needed - predniSONE (DELTASONE) 20 MG tablet; Take 3 PO QAM x3days, 2 PO QAM x3days, 1 PO QAM x3days  Dispense: 18 tablet; Refill: 0 - Ambulatory referral to Physical Therapy  2. Muscle spasm - cyclobenzaprine (FLEXERIL) 10 MG tablet; Take 1 tablet (10 mg total) by mouth 3 (three) times daily as needed for muscle spasms.  Dispense: 60 tablet; Refill: 1   Benjiman CoreBrittany Dorathy Stallone PA-C  [facility] 01/05/2017 12:43 PM

## 2017-01-09 ENCOUNTER — Ambulatory Visit (INDEPENDENT_AMBULATORY_CARE_PROVIDER_SITE_OTHER): Payer: BLUE CROSS/BLUE SHIELD

## 2017-01-09 ENCOUNTER — Ambulatory Visit (INDEPENDENT_AMBULATORY_CARE_PROVIDER_SITE_OTHER): Payer: BLUE CROSS/BLUE SHIELD | Admitting: Physician Assistant

## 2017-01-09 VITALS — BP 138/88 | HR 82 | Temp 97.6°F | Resp 16 | Ht 75.0 in | Wt 282.0 lb

## 2017-01-09 DIAGNOSIS — M5430 Sciatica, unspecified side: Secondary | ICD-10-CM

## 2017-01-09 LAB — GLUCOSE, POCT (MANUAL RESULT ENTRY): POC Glucose: 199 mg/dl — AB (ref 70–99)

## 2017-01-09 LAB — POCT GLYCOSYLATED HEMOGLOBIN (HGB A1C): Hemoglobin A1C: 5.8

## 2017-01-09 MED ORDER — HYDROCODONE-ACETAMINOPHEN 7.5-325 MG PO TABS: 1.0000 | ORAL_TABLET | Freq: Four times a day (QID) | ORAL | 0 refills | Status: AC | PRN

## 2017-01-09 MED ORDER — METHYLPREDNISOLONE SODIUM SUCC 125 MG IJ SOLR
125.0000 mg | Freq: Once | INTRAMUSCULAR | Status: AC
  Administered 2017-01-09: 125 mg via INTRAMUSCULAR

## 2017-01-09 MED ORDER — PREDNISONE 10 MG PO TABS: ORAL_TABLET | ORAL | 0 refills | Status: AC

## 2017-01-09 MED ORDER — KETOROLAC TROMETHAMINE 60 MG/2ML IM SOLN
60.0000 mg | Freq: Once | INTRAMUSCULAR | Status: AC
  Administered 2017-01-09: 60 mg via INTRAMUSCULAR

## 2017-01-09 NOTE — Patient Instructions (Addendum)
Start the prednisone course tomorrow. You may use the pain medication every 6 hours as needed. You should start seeing improvement over the next 24-48 hours. I will contact PT and make sure they can get you in soon. Please return if symptoms worsen or you develop any new concerning symptoms. Thank you for letting me participate in your health and well being.    IF you received an x-ray today, you will receive an invoice from [facility]. Please contact [facility] at [phone] with questions or concerns regarding your invoice.   IF you received labwork today, you will receive an invoice from [facility]. Please contact [facility] at [phone] with questions or concerns regarding your invoice.   Our billing staff will not be able to assist you with questions regarding bills from these companies.  You will be contacted with the lab results as soon as they are available. The fastest way to get your results is to activate your My Chart account. Instructions are located on the last page of this paperwork. If you have not heard from us regarding the results in 2 weeks, please contact this office.

## 2017-01-09 NOTE — Progress Notes (Signed)
MRN: [id number] DOB: 06/03/1976  Subjective:   Frederick Butler is a 41 y.o. male presenting for chief complaint of Back Pain (X 1 week, pt says pain going down rt. leg, numbing of foot)  Pt initially examined by me on 01/05/17 for worsening back pain x 3 days after doing some heavy lifting at work. He was also having some right sided sciatica, which patient had never experienced before. Exam revealed a remarkable spasm of right lower back musculature. He declined a steroid injection that visit as he just wanted to go home. He was given 9 day steroid taper and flexeril and told to follow up if pain worsened or did not improve. Pt comes in today because he has not gotten any relief with his sciatic pain. Notes he continues to have dull sensation from his right buttocks to his foot and will experience a sharp shooting/burning sensation down the back of the leg if he moves in certain positions or applies pressure to the affected side. Has has an intermittent numbness sensation in his right foot. Denies urinary/bowel incontinence, saddle anesthesia, weakness in legs, or tingling. Notes he has taken both the flexeril and prednisone daily. Last dose of both medications was yesterday (10mg  of flexeril and 40mg  of prednisone). States his back pain/spasm has improved.    Frederick Butler has a current medication list which includes the following prescription(s): cyclobenzaprine, ibuprofen, hydrocodone-acetaminophen, and prednisone. Also has No Known Allergies.  Frederick Butler  has no past medical history on file. Also  has a past surgical history that includes Finger surgery.   Objective:   Vitals: BP 138/88 (BP Location: Right Arm, Patient Position: Sitting, Cuff Size: Large)   Pulse 82   Temp 97.6 F (36.4 C) (Oral)   Resp 16   Ht 6\' 3"  (1.905 m)   Wt 282 lb (127.9 kg)   SpO2 95%   BMI 35.25 kg/m   Physical Exam  Constitutional: He is oriented to person, place, and time. He appears well-developed  and well-nourished. He appears distressed (in a great deal of pain, avoiding sitting on right side of buttocks in the chair).  HENT:  Head: Normocephalic and atraumatic.  Eyes: Conjunctivae are normal.  Neck: Normal range of motion.  Pulmonary/Chest: Effort normal.  Musculoskeletal:       Right ankle: He exhibits normal range of motion ( full flexion and extension).       Left ankle: He exhibits normal range of motion (full flexion and extension).  Neurological: He is alert and oriented to person, place, and time. He has normal strength. No sensory deficit.  Reflex Scores:      Patellar reflexes are 2+ on the right side and 2+ on the left side.      Achilles reflexes are 2+ on the right side and 2+ on the left side. Positive SLR on right side at 30 degrees  Skin: Skin is warm and dry.  Psychiatric: He has a normal mood and affect.  Vitals reviewed.   Results for orders placed or performed in visit on 01/09/17 (from the past 24 hour(s))  POCT glucose (manual entry)     Status: Abnormal   Collection Time: 01/09/17  9:07 AM  Result Value Ref Range   POC Glucose 199 (A) 70 - 99 mg/dl  POCT glycosylated hemoglobin (Hb A1C)     Status: None   Collection Time: 01/09/17  9:36 AM  Result Value Ref Range   Hemoglobin A1C 5.8     Dg Lumbar  Spine Complete  Result Date: 01/09/2017 CLINICAL DATA:  Low back pain. EXAM: LUMBAR SPINE - COMPLETE 4+ VIEW COMPARISON:  Radiographs of October 18, 2016. FINDINGS: No fracture or spondylolisthesis is noted. Mild degenerative disc disease is noted at L1-2, L2-3 and L4-5 with minimal anterior osteophyte formation. Posterior facet joints appear normal. IMPRESSION: Mild multilevel degenerative disc disease. No acute abnormality seen in the lumbar spine. Electronically Signed   By: Lupita RaiderJames  Green Jr, M.D.   On: 01/09/2017 09:04   Post solumedrol and ketorolac injection, HR decreased from 122 bpm to 82 bpm. Pt states the pain is much more tolerable. He is able to  sit on affected side.   Assessment and Plan :  This case was precepted with Dr. Clelia CroftShaw.   1. Sciatic leg pain -Improved in office. Prednisone taper has been changed from last visit due to in office solumedrol injection today of 125mg . Pt is to start new prednisone taper tomorrow. He was given small quantity of norco to use as needed for pain. Instructed to go to PT as soon as they can make an appointment for him. PT referral was placed at his last visit. Continue using ice/heat to affected area daily and stretching if possible. Return to clinic if symptoms worsen, do not improve in 7-10 days, or as needed.  - DG Lumbar Spine Complete; Future - ketorolac (TORADOL) injection 60 mg; Inject 2 mLs (60 mg total) into the muscle once. - methylPREDNISolone sodium succinate (SOLU-MEDROL) 125 mg/2 mL injection 125 mg; Inject 2 mLs (125 mg total) into the muscle once. - POCT glucose (manual entry) - POCT glycosylated hemoglobin (Hb A1C) - predniSONE (DELTASONE) 10 MG tablet; 10-8-6-4-2-1 taper. Take all the tablets for that day in the am with food.  Dispense: 31 tablet; Refill: 0 - HYDROcodone-acetaminophen (NORCO) 7.5-325 MG tablet; Take 1 tablet by mouth every 6 (six) hours as needed.  Dispense: 15 tablet; Refill: 0  Frederick CoreBrittany Vernisha Bacote, PA-C [facility] [phone] 01/09/2017 2:03 PM

## 2018-09-18 IMAGING — DX DG LUMBAR SPINE COMPLETE 4+V
5 series · 5 of 5 positions shown · non-contrast
Comparison: Radiographs October 18, 2016.

CLINICAL DATA: Low back pain.

EXAM:
LUMBAR SPINE - COMPLETE 4+ VIEW

[l-spine ap]
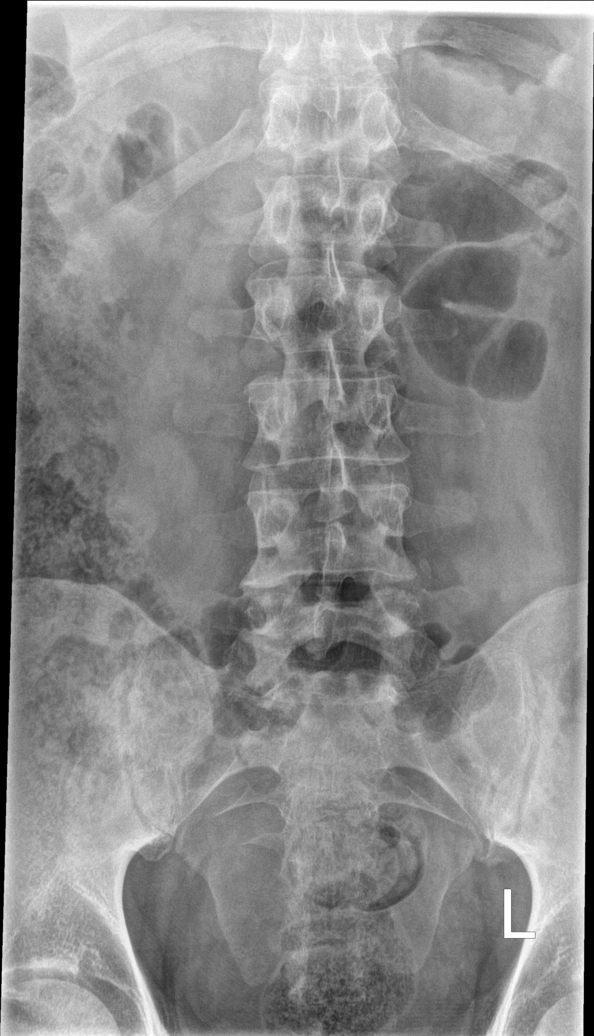

[l-spine obl (1 of 2)]
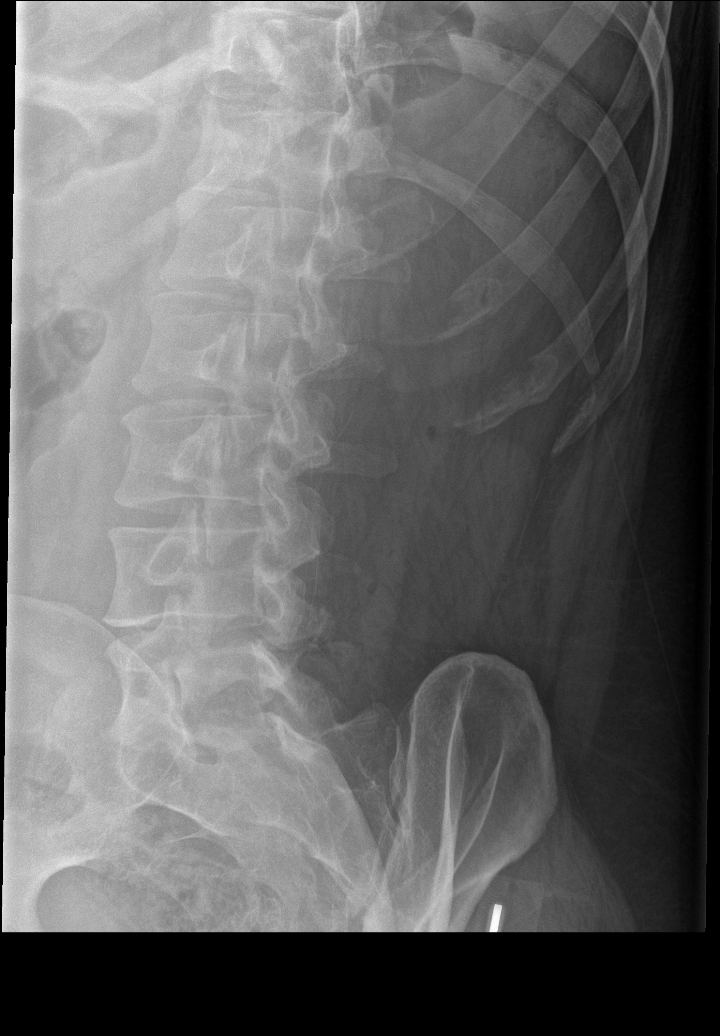

[l-spine obl (2 of 2)]
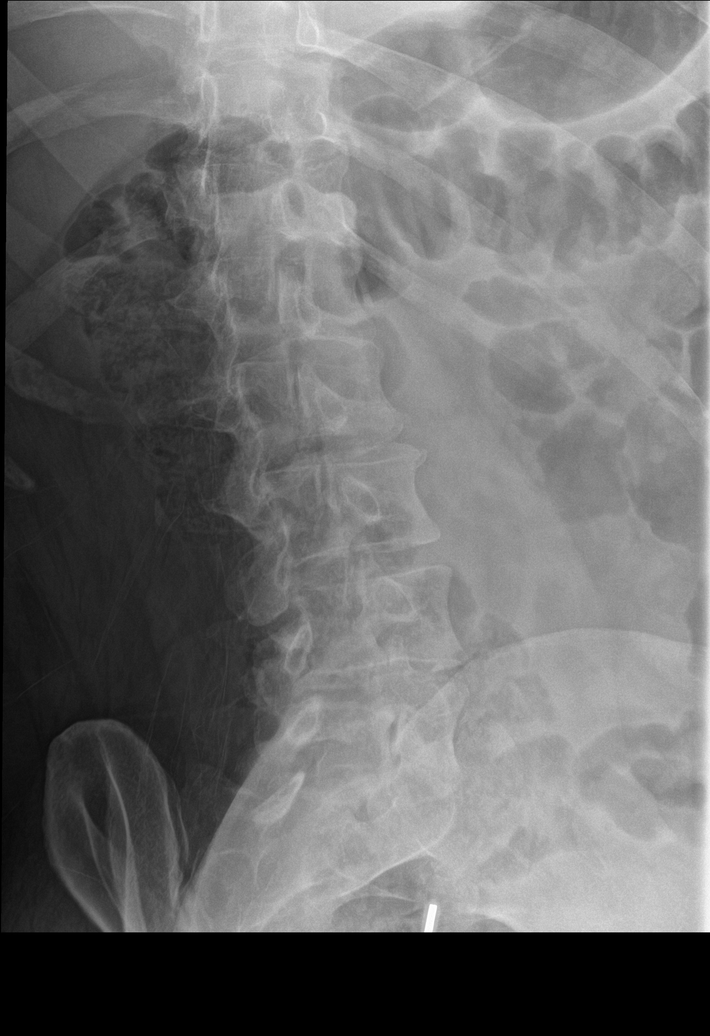

[l-spine lat]
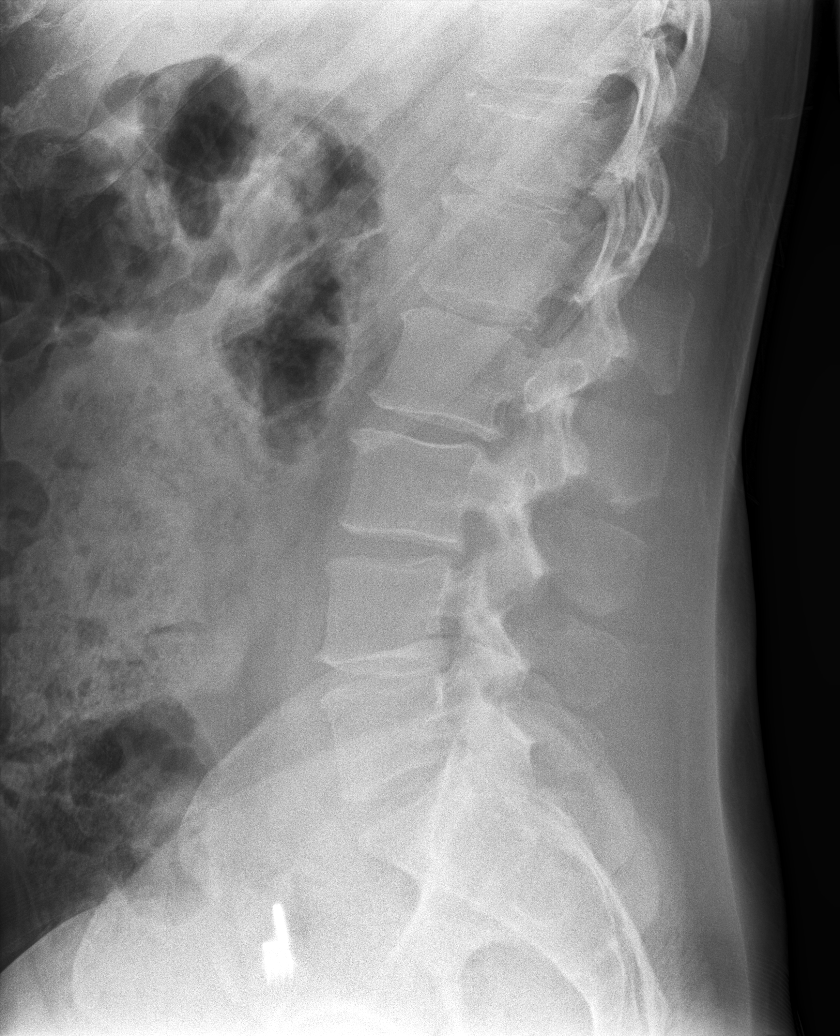

[l-spine l5-s1]
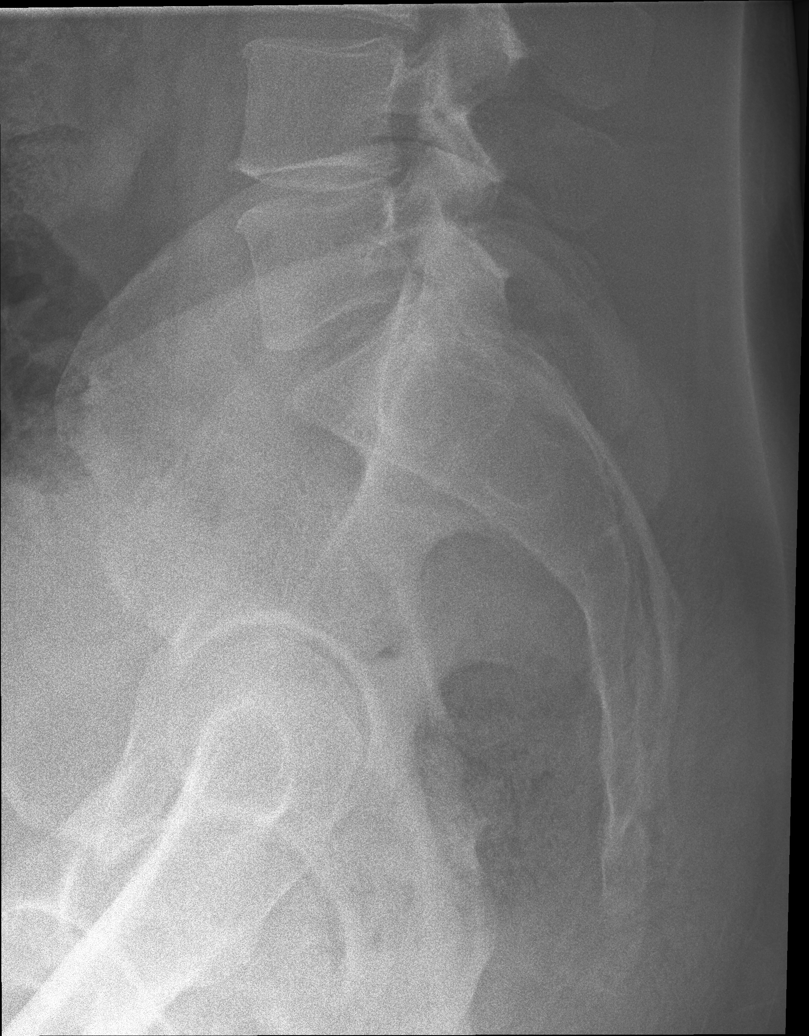

[5 of 5 positions shown; findings below may reference images not displayed]

FINDINGS: No fracture or spondylolisthesis is noted. Mild degenerative disc
disease is noted at L1-2, L2-3 and L4-5 with minimal anterior
osteophyte formation. Posterior facet joints appear normal.
IMPRESSION: Mild multilevel degenerative disc disease. No acute abnormality seen
in the lumbar spine.
